# Patient Record
Sex: Male | Born: 1985 | Hispanic: Yes | Marital: Single
Health system: Southern US, Community
[De-identification: ages and names within clinical notes are randomized; demographics above are authoritative.]

---

## 2021-11-20 ENCOUNTER — Emergency Department
Admission: EM | Admit: 2021-11-20 | Discharge: 2021-11-20 | Disposition: A | Discharge status: Home / Self Care | Payer: Self-pay | Attending: Emergency Medicine | Admitting: Emergency Medicine

## 2021-11-20 ENCOUNTER — Emergency Department: Payer: Self-pay

## 2021-11-20 ENCOUNTER — Other Ambulatory Visit: Payer: Self-pay

## 2021-11-20 ENCOUNTER — Encounter: Payer: Self-pay | Admitting: Emergency Medicine

## 2021-11-20 DIAGNOSIS — T1591XA Foreign body on external eye, part unspecified, right eye, initial encounter: Secondary | ICD-10-CM

## 2021-11-20 DIAGNOSIS — T1501XA Foreign body in cornea, right eye, initial encounter: Secondary | ICD-10-CM | POA: Insufficient documentation

## 2021-11-20 DIAGNOSIS — X58XXXA Exposure to other specified factors, initial encounter: Secondary | ICD-10-CM | POA: Insufficient documentation

## 2021-11-20 DIAGNOSIS — S0501XA Injury of conjunctiva and corneal abrasion without foreign body, right eye, initial encounter: Secondary | ICD-10-CM

## 2021-11-20 DIAGNOSIS — Z23 Encounter for immunization: Secondary | ICD-10-CM | POA: Insufficient documentation

## 2021-11-20 MED ORDER — POLYMYXIN B-TRIMETHOPRIM 10000-0.1 UNIT/ML-% OP SOLN: 2.0000 [drp] | Freq: Four times a day (QID) | OPHTHALMIC | 0 refills | Status: AC

## 2021-11-20 MED ORDER — TETANUS-DIPHTH-ACELL PERTUSSIS 5-2.5-18.5 LF-MCG/0.5 IM SUSY
0.5000 mL | PREFILLED_SYRINGE | Freq: Once | INTRAMUSCULAR | Status: AC
  Administered 2021-11-20: 0.5 mL via INTRAMUSCULAR
  Filled 2021-11-20: qty 0.5

## 2021-11-20 MED ORDER — TETRACAINE HCL 0.5 % OP SOLN
2.0000 [drp] | Freq: Once | OPHTHALMIC | Status: AC
  Administered 2021-11-20: 2 [drp] via OPHTHALMIC
  Filled 2021-11-20: qty 4

## 2021-11-20 MED ORDER — FLUORESCEIN SODIUM 1 MG OP STRP
1.0000 | ORAL_STRIP | Freq: Once | OPHTHALMIC | Status: AC
  Administered 2021-11-20: 1 via OPHTHALMIC

## 2021-11-20 MED ORDER — POLYMYXIN B-TRIMETHOPRIM 10000-0.1 UNIT/ML-% OP SOLN
2.0000 [drp] | Freq: Once | OPHTHALMIC | Status: AC
  Administered 2021-11-20: 2 [drp] via OPHTHALMIC
  Filled 2021-11-20: qty 10

## 2021-11-20 MED ORDER — FLUORESCEIN SODIUM 1 MG OP STRP
ORAL_STRIP | OPHTHALMIC | Status: AC
Start: 2021-11-20 — End: 2021-11-20
  Administered 2021-11-20: 1 via OPHTHALMIC
  Filled 2021-11-20: qty 1

## 2021-11-20 NOTE — ED Provider Notes (Signed)
?[facility] EMERGENCY DEPARTMENT ?Provider Note ? ? ?CSN: [id number] ?Arrival date & time: 11/20/21  1832 ? ?  ? ?History ? ?Chief Complaint  ?Patient presents with  ? Eye Pain  ? ? ?Glen Anderson is a 36 y.o. male.  Presents to the emergency department for evaluation of right eye pain.  Was originally seen earlier today at urgent care and had a foreign body in his right eye that cannot be removed.  Patient states he was using a grinding wheel metal and a piece of metal shot into his right.  He has pain, no vision changes but does have some foreign body sensation in the right eye.  He denies any other injury to his body. ? ?HPI ? ?  ? ?Home Medications ?Prior to Admission medications   ?Medication Sig Start Date End Date Taking? Authorizing Provider  ?trimethoprim-polymyxin b (POLYTRIM) ophthalmic solution Place 2 drops into the right eye every 6 (six) hours for 7 days. 11/20/21 11/27/21 Yes Evon Slack, PA-C  ?   ? ?Allergies    ?Patient has no allergy information on record.   ? ?Review of Systems   ?Review of Systems ? ?Physical Exam ?Updated Vital Signs ?BP 130/87 (BP Location: Right Arm)   Pulse 88   Temp 98 ?F (36.7 ?C) (Oral)   Resp 20   Ht 5' 2.99" (1.6 m)   Wt 91 kg   SpO2 99%   BMI 35.55 kg/m?  ?Physical Exam ?Constitutional:   ?   Appearance: He is well-developed.  ?HENT:  ?   Head: Normocephalic and atraumatic.  ?Eyes:  ?   Conjunctiva/sclera: Conjunctivae normal.  ?   Comments: Right eye with small metallic foreign body along the 7 to 8 o'clock position just along the edge of the cornea.  There is tenderness to palpation along this area.  Fluorescein stain is used with no leaking fluid, very tiny corneal abrasion just above the foreign body that is linear and does not appear to be very deep.  Pupils equal round reactive to light.  Normal EOM.  No other visible or palpable foreign body noted throughout the right eye.  Lids are everted and swept with no foreign body.   Foreign body was able to be removed with a 25-gauge needle.  ?Cardiovascular:  ?   Rate and Rhythm: Normal rate.  ?Pulmonary:  ?   Effort: Pulmonary effort is normal. No respiratory distress.  ?Musculoskeletal:     ?   General: Normal range of motion.  ?   Cervical back: Normal range of motion.  ?Skin: ?   General: Skin is warm.  ?   Findings: No rash.  ?Neurological:  ?   Mental Status: He is alert and oriented to person, place, and time.  ?Psychiatric:     ?   Behavior: Behavior normal.     ?   Thought Content: Thought content normal.  ? ? ?ED Results / Procedures / Treatments   ?Labs ?(all labs ordered are listed, but only abnormal results are displayed) ?Labs Reviewed - No data to display ? ?EKG ?None ? ?Radiology ?CT Orbits Wo Contrast ? ?Result Date: 11/20/2021 ?CLINICAL DATA:  Foreign body, eye FB right eye metal Patient reports a piece of metal CT in right eye yesterday at work. No visual difficulties. Right eye redness. EXAM: CT ORBITS WITHOUT CONTRAST TECHNIQUE: Multidetector CT imaging of the orbits was performed using the standard protocol without intravenous contrast. Multiplanar CT image reconstructions were also generated.  RADIATION DOSE REDUCTION: This exam was performed according to the departmental dose-optimization program which includes automated exposure control, adjustment of the mA and/or kV according to patient size and/or use of iterative reconstruction technique. COMPARISON:  No pertinent prior exam. FINDINGS: Orbits: No orbital foreign body. No orbital mass or evidence of inflammation. Normal appearance of the globes, optic nerve-sheath complexes, extraocular muscles, orbital fat and lacrimal glands. Visible paranasal sinuses: Circumferential mucosal thickening of the maxillary sinuses. Patchy mucosal thickening of ethmoid air cells. No sinus fluid levels. Soft tissues: There is a tiny punctate skin density in the right supraorbital soft tissues, series 3, image 37. This appears to be a  small skin calcification, and does not appear metallic. This is not within the orbit. Osseous: No fracture or aggressive lesion. Limited intracranial: No acute or significant finding. IMPRESSION: 1. No orbital foreign body. 2. Tiny punctate skin calcification in the right supraorbital soft tissues, not within the orbit. 3. Maxillary sinus mucosal thickening. Electronically Signed   By: Narda Rutherford M.D.   On: 11/20/2021 20:19   ? ?Procedures ?Procedures  ? ? ?Medications Ordered in ED ?Medications  ?Tdap (BOOSTRIX) injection 0.5 mL (has no administration in time range)  ?trimethoprim-polymyxin b (POLYTRIM) ophthalmic solution 2 drop (has no administration in time range)  ?fluorescein ophthalmic strip 1 strip (1 strip Left Eye Given 11/20/21 1945)  ?tetracaine (PONTOCAINE) 0.5 % ophthalmic solution 2 drop (2 drops Left Eye Given 11/20/21 1945)  ? ? ?ED Course/ Medical Decision Making/ A&P ?  ?                        ?Medical Decision Making ?Amount and/or Complexity of Data Reviewed ?Radiology: ordered. ? ?Risk ?Prescription drug management. ? ? ?37 year old male with right eye foreign body, small piece of metal.  Patient also noted to have a very small corneal abrasion.  Metal was removed and a very tiny rust ring remains present.  CT orbits obtained showing no penetrated foreign body.  Discussed with ophthalmologist who recommended follow-up Monday morning.  Patient will call the office to schedule follow-up appointment.  In the meantime patient will use artificial tears and antibiotic eyedrops.  He understands signs symptoms return to the ER for. ?Final Clinical Impression(s) / ED Diagnoses ?Final diagnoses:  ?Abrasion of right cornea, initial encounter  ?Foreign body of right eye, initial encounter  ? ? ?Rx / DC Orders ?ED Discharge Orders   ? ?      Ordered  ?  trimethoprim-polymyxin b (POLYTRIM) ophthalmic solution  Every 6 hours       ? 11/20/21 2033  ? ?  ?  ? ?  ? ? ?  ?Evon Slack, PA-C ?11/20/21  2037 ? ?  ?Chesley Noon, MD ?11/24/21 1611 ? ?

## 2021-11-20 NOTE — ED Notes (Addendum)
Spanish interpreter c/o Aniceto Boss [phone] utilized to give instructions to patient  ?Visual acuity  ? ?Lt eye 20/20 ?Rt eye 20/20 ?Both 20/20 ?

## 2021-11-20 NOTE — Discharge Instructions (Addendum)
Please call ophthalmology office Monday morning and let them know you had a metal foreign body in your right eye.  In the meantime use topical antibiotic eyedrops as prescribed.  You may use over-the-counter artificial tears as needed for comfort.  Return to the ER for any worsening symptoms or urgent changes in your health. ?

## 2021-11-20 NOTE — ED Notes (Signed)
PA at bedside for eye exam, use of interpreter Dois Davenport [phone].  Per report, went to clinic and was referred here in ED d/t having metal in eye and will require specialist  ?

## 2021-11-20 NOTE — ED Triage Notes (Addendum)
Pt states a piece of metal got in his right eye yesterday at work. Pt denies visual difficulties. Right eye is red. Pt was seen at clinic- they could not remove foreign object from eye and referred him here.  ?

## 2023-05-01 IMAGING — CT CT ORBITS W/O CM
3 of 6 series · 13 of 47 positions shown, 15 images · non-contrast
Comparison: No pertinent prior exam.

CLINICAL DATA: Foreign body, eye FB right eye metal

No visual difficulties. Right eye redness.
EXAM:
CT ORBITS WITHOUT CONTRAST
TECHNIQUE: Multidetector CT imaging of the orbits was performed using the
standard protocol without intravenous contrast. Multiplanar CT image
reconstructions were also generated.
RADIATION DOSE REDUCTION: This exam was performed according to the
departmental dose-optimization program which includes automated
exposure control, adjustment of the mA and/or kV according to
patient size and/or use of iterative reconstruction technique.

[Series 2: orbits 2.0 hr60 bone · axial · 0.33mm/px · z∈[+202,+286]mm · 8 of 50 slices shown, 10 images]
[im 4/50  brain]
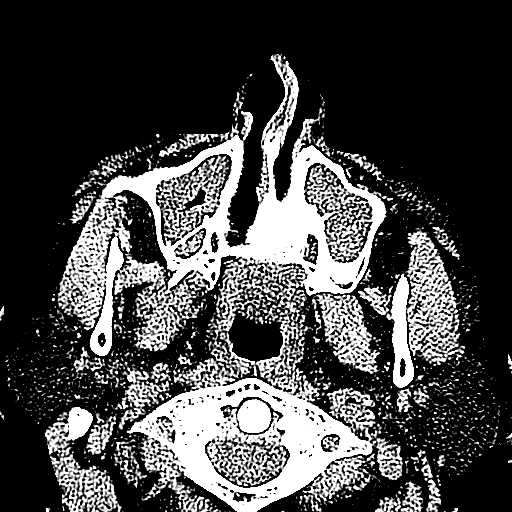
[im 4/50  bone]
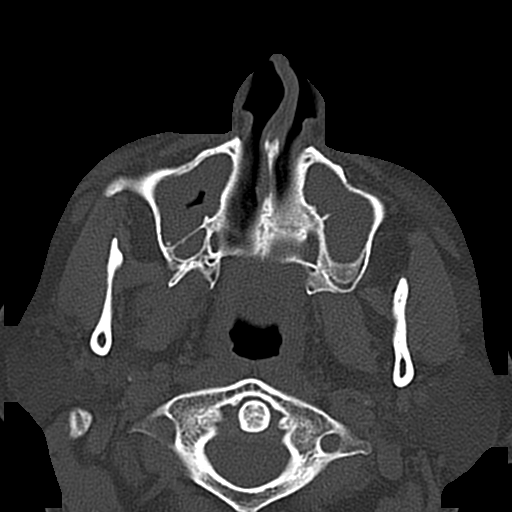
[im 11/50  bone]
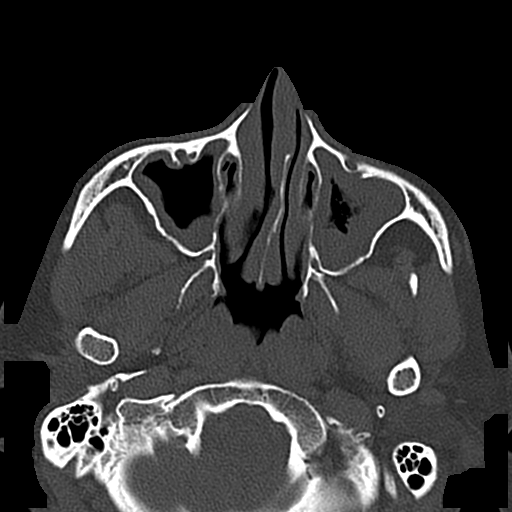
[im 18/50  bone]
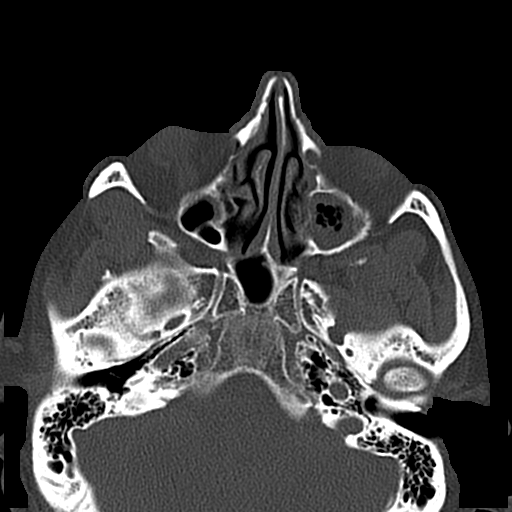
[im 22/50  bone]
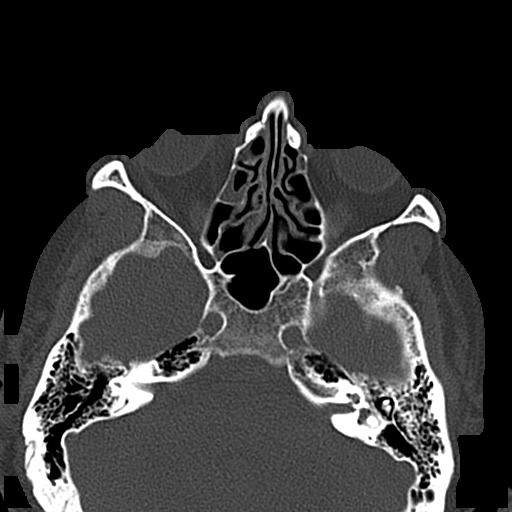
[im 29/50  brain]
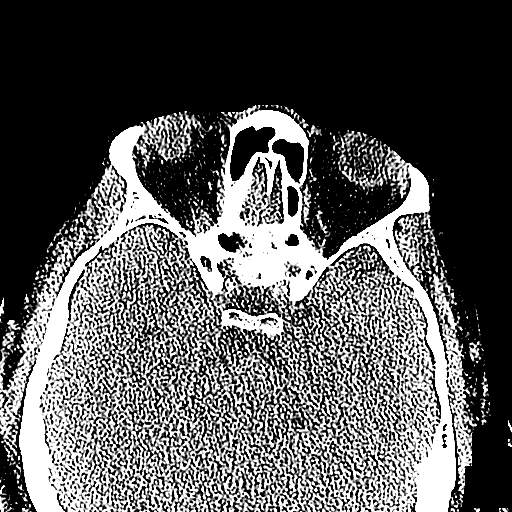
[im 29/50  bone]
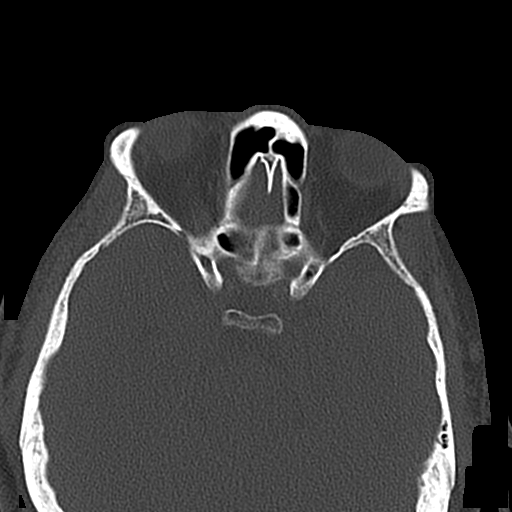
[im 32/50  bone]
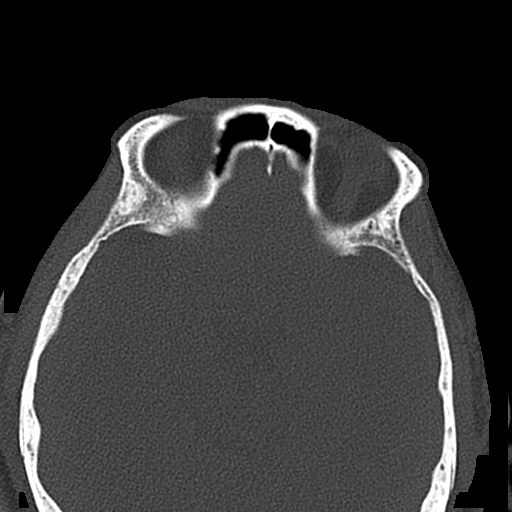
[im 39/50  bone]
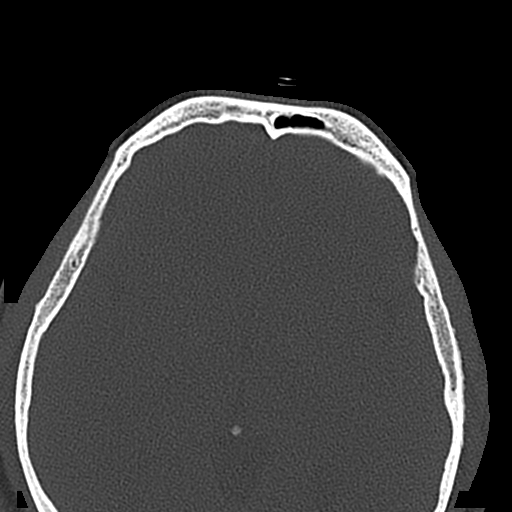
[im 46/50  bone]
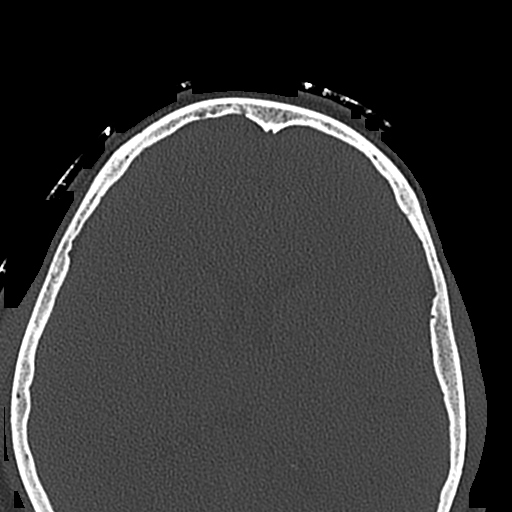

[Series 4: orbits 2.0 coronal · coronal · 0.19mm/px · 3 of 74 slices shown]
[im 19/74  bone]
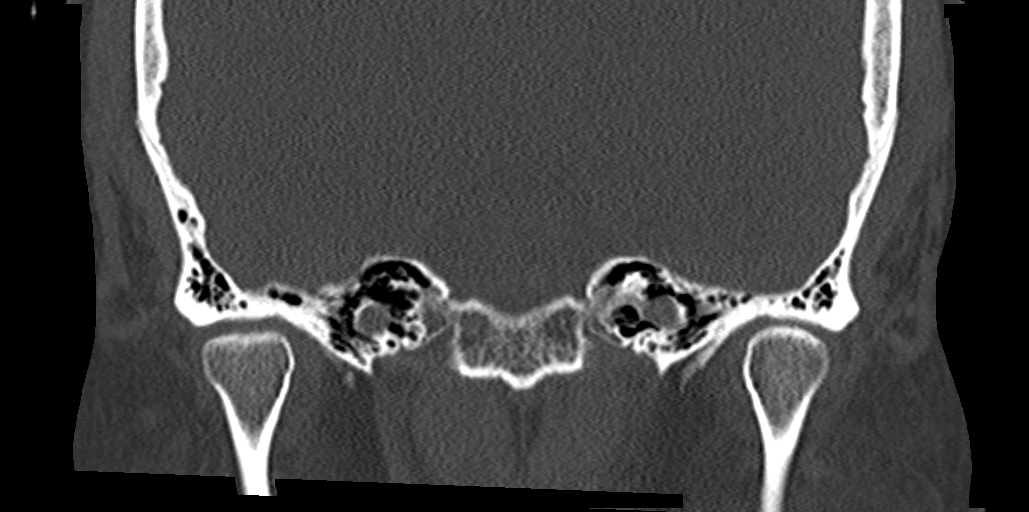
[im 37/74  bone]
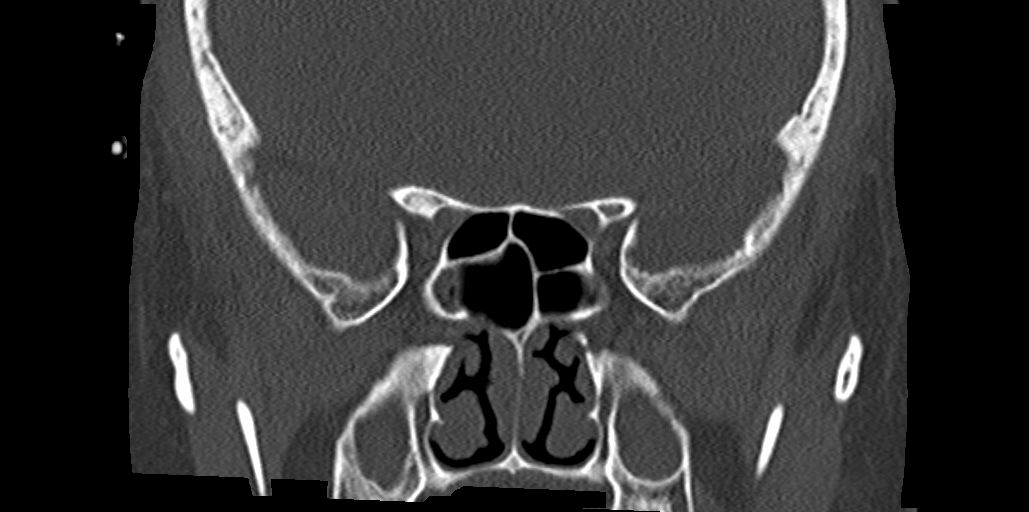
[im 55/74  bone]
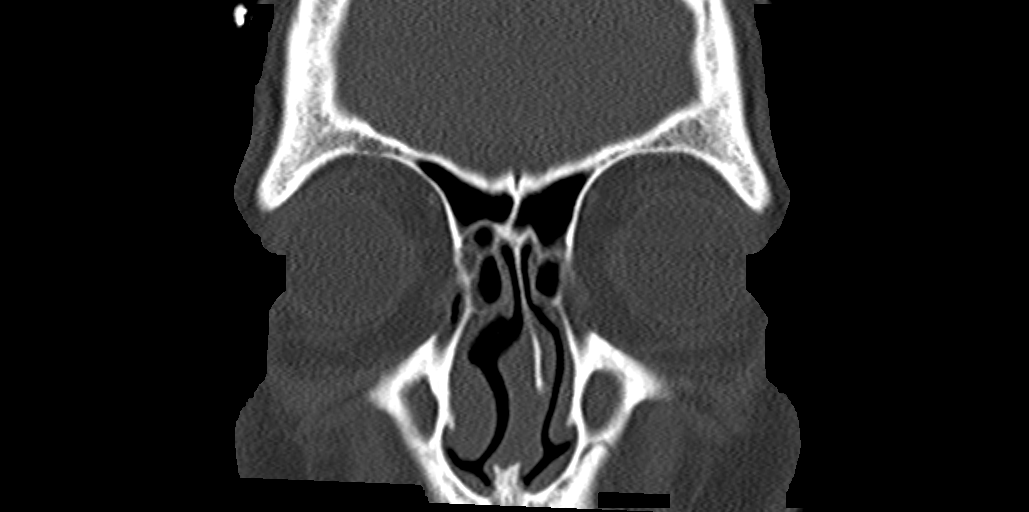

[Series 9: orbits 2.0 sagittal · sagittal · 0.20mm/px · 2 of 98 slices shown]
[im 33/98  bone]
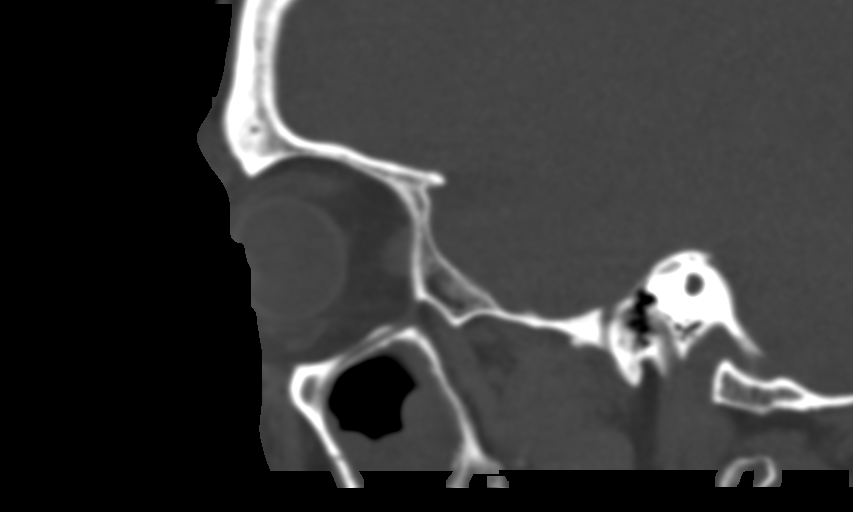
[im 65/98  bone]
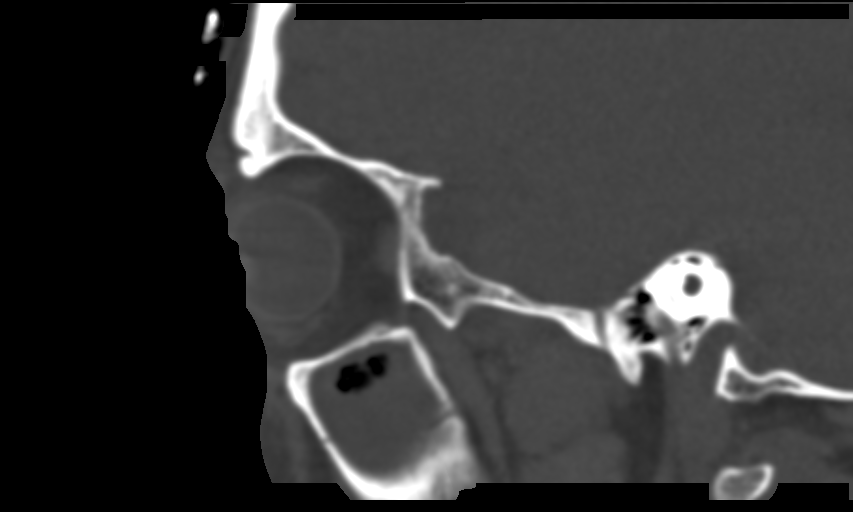

[13 of 47 positions shown; findings below may reference images not displayed]

FINDINGS: Orbits: No orbital foreign body. No orbital mass or evidence of
inflammation. Normal appearance of the globes, optic nerve-sheath
complexes, extraocular muscles, orbital fat and lacrimal glands.

Visible paranasal sinuses: Circumferential mucosal thickening of the
maxillary sinuses. Patchy mucosal thickening of ethmoid air cells.
No sinus fluid levels.

Soft tissues: There is a tiny punctate skin density in the right
supraorbital soft tissues, series 3, image 37. This appears to be a
small skin calcification, and does not appear metallic. This is not
within the orbit.

Osseous: No fracture or aggressive lesion.

Limited intracranial: No acute or significant finding.
IMPRESSION: 1. No orbital foreign body.
2. Tiny punctate skin calcification in the right supraorbital soft
tissues, not within the orbit.
3. Maxillary sinus mucosal thickening.
